# Patient Record
Sex: Male | Born: 1970
Health system: Southern US, Community
[De-identification: ages and names within clinical notes are randomized; demographics above are authoritative.]

## PROBLEM LIST (undated history)

## (undated) DIAGNOSIS — J45909 Unspecified asthma, uncomplicated: Secondary | ICD-10-CM

## (undated) HISTORY — DX: Unspecified asthma, uncomplicated: J45.909

---

## 2004-11-08 ENCOUNTER — Emergency Department: Payer: Self-pay | Admitting: Emergency Medicine

## 2008-10-12 ENCOUNTER — Emergency Department: Payer: Self-pay | Admitting: Emergency Medicine

## 2010-05-26 ENCOUNTER — Inpatient Hospital Stay: Payer: Self-pay | Admitting: Internal Medicine

## 2012-04-08 IMAGING — CT CT NECK WITH CONTRAST
2 series · 10 of 14 positions shown, 12 images · IV contrast (isovue)
Comparison: none

REASON FOR EXAM: Sore throat, likely ludwig's angina, D/W ENT
COMMENTS:

PROCEDURE:     CT  - CT NECK WITH CONTRAST  - May 26, 2010  [DATE]
RESULT:     Comparison: None
TECHNIQUE: Multiple sequential axial images from the apices of the lungs to
the level of the orbits obtained with 75 mL Isovue 370 IV contrast.

[Series 2: soft tissue · axial · 0.49mm/px · z∈[-314,-92]mm · 8 of 96 slices shown, 10 images]
[im 11/96  soft-tissue]
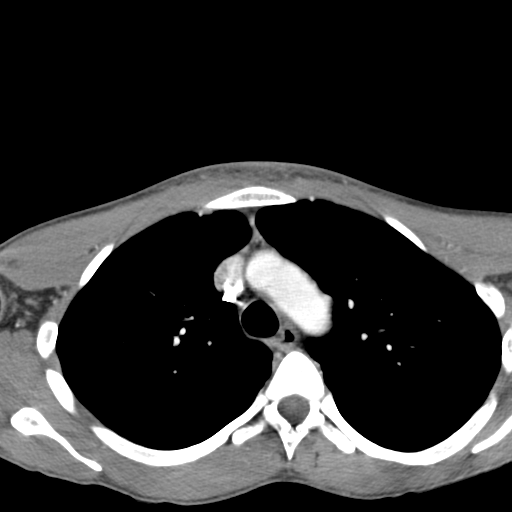
[im 11/96  bone]
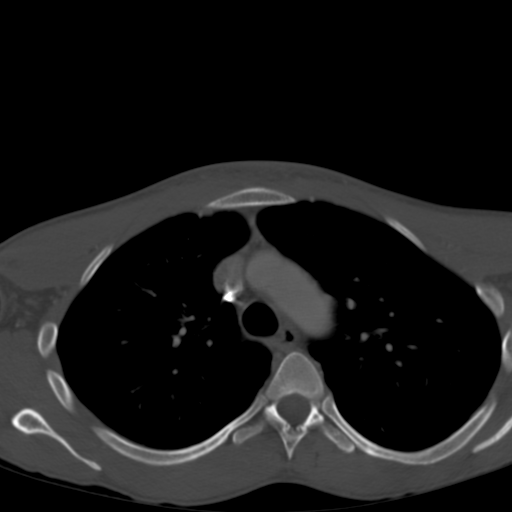
[im 22/96  bone]
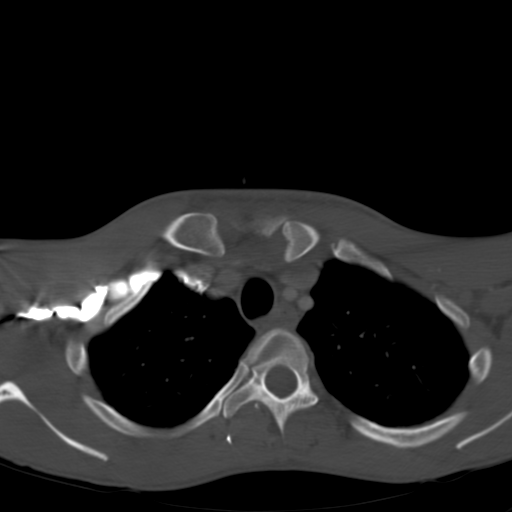
[im 32/96  bone]
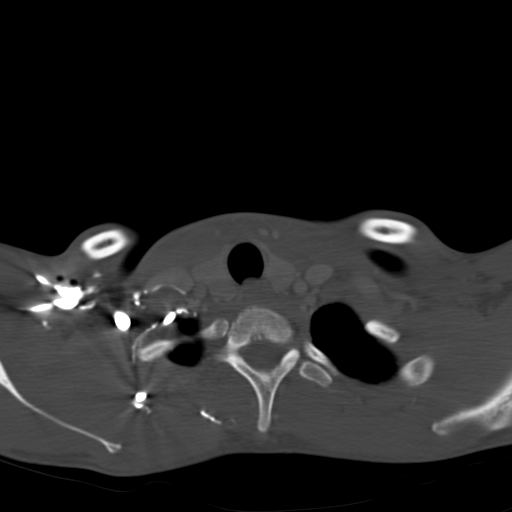
[im 43/96  bone]
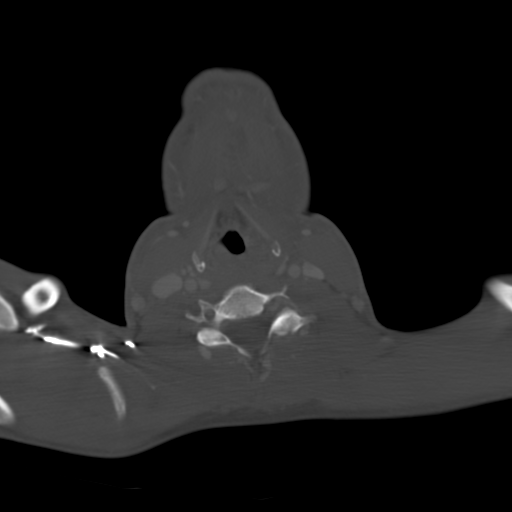
[im 53/96  soft-tissue]
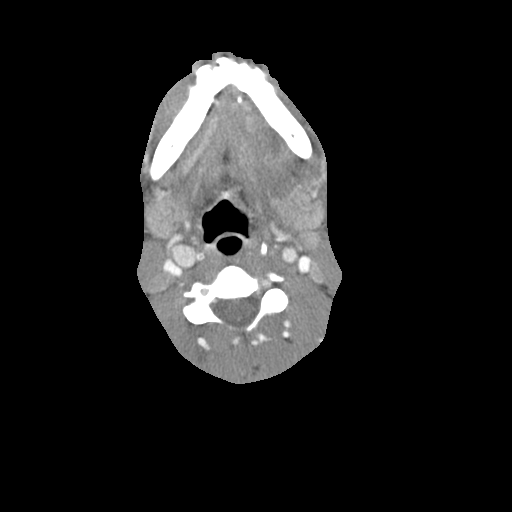
[im 53/96  bone]
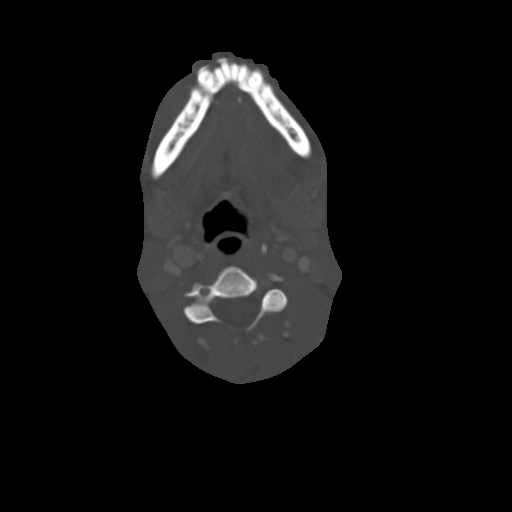
[im 64/96  bone]
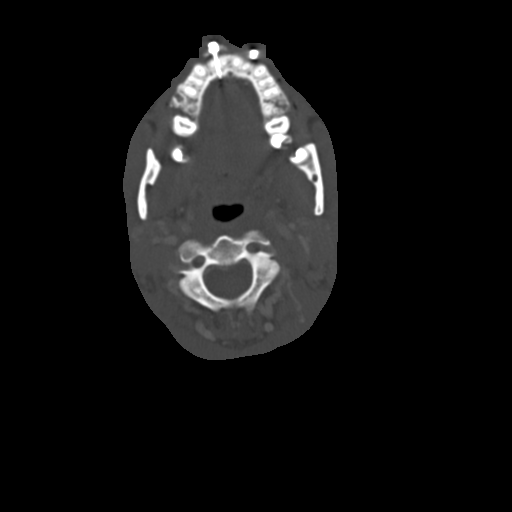
[im 74/96  bone]
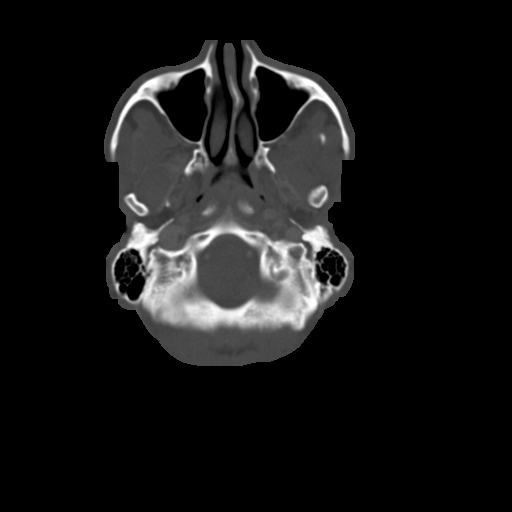
[im 85/96  bone]
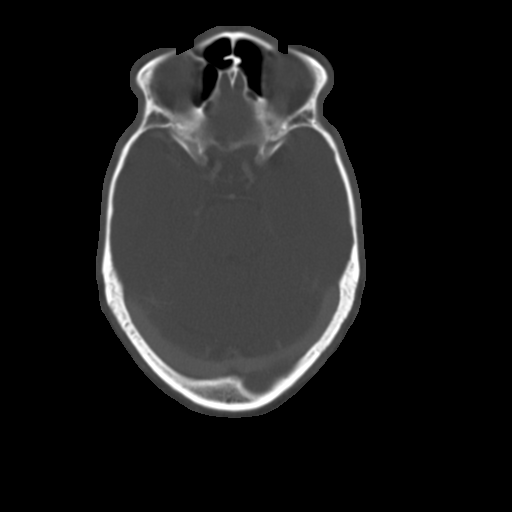

[Series 4: lung windows · axial · 0.61mm/px · z∈[-310,-274]mm · 2 of 36 slices shown]
[im 12/36  bone]
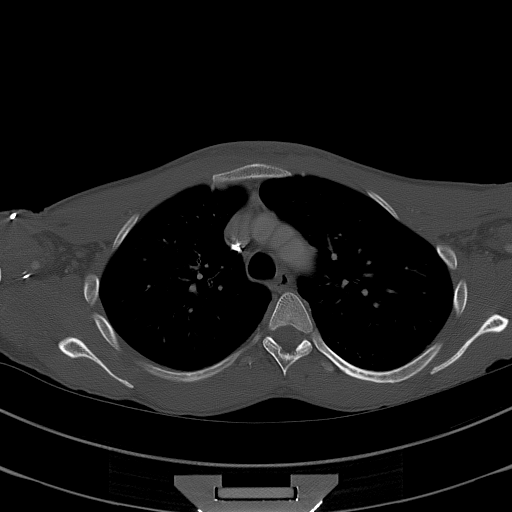
[im 24/36  bone]
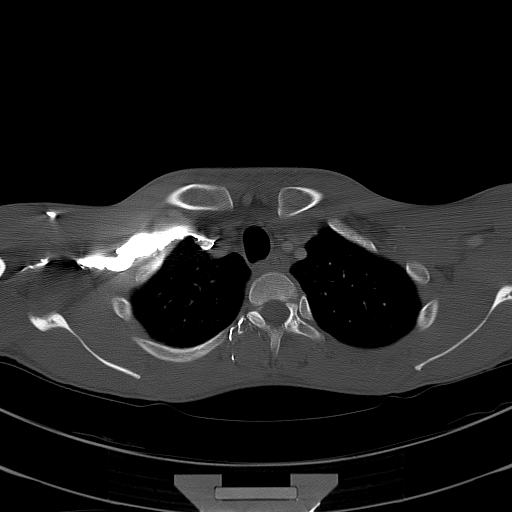

[10 of 14 positions shown; findings below may reference images not displayed]

FINDINGS: There is a 2.2 x 1.6 cm peripherally enhancing fluid collection in the
sublingual space, concerning for developing abscess. There is adjacent
inflammatory changes and soft tissue stranding. This soft tissue thickening
causes minimal mass effect on the anterior airway at the level of the
oropharynx. There is an area of low-attenuation is extends to the left
inferior molar. There are several small adjacent other prominent lymph
nodes. None of these are pathologically enlarged.

The prevertebral soft tissues are within normal limits. The superior
mediastinum is within normal limits. Nonspecific subcutaneous fat stranding
seen along the anterior neck and extending along the visualized anterior
chest wall.

No aggressive but lesions identified. Internal fixation hardware is
penetrated in the symphysis of the mandible.

There are periapical lucencies surrounding the right molars, concerning for
dental caries.
IMPRESSION: Findings concerning for developing abscess in the sublingual space. This may
extend from a left molar. There is minimal mass effect on the airway
anteriorly.

This was reported to Dr. Sheyanale Beckz at4443 hours 05/26/2010.

## 2022-08-05 ENCOUNTER — Encounter: Payer: Self-pay | Admitting: Nurse Practitioner

## 2022-08-05 ENCOUNTER — Ambulatory Visit (INDEPENDENT_AMBULATORY_CARE_PROVIDER_SITE_OTHER): Payer: Medicaid Other | Admitting: Nurse Practitioner

## 2022-08-05 VITALS — BP 126/82 | HR 87 | Ht 70.5 in | Wt 125.9 lb

## 2022-08-05 DIAGNOSIS — R636 Underweight: Secondary | ICD-10-CM

## 2022-08-05 DIAGNOSIS — Z23 Encounter for immunization: Secondary | ICD-10-CM

## 2022-08-05 DIAGNOSIS — J4520 Mild intermittent asthma, uncomplicated: Secondary | ICD-10-CM | POA: Insufficient documentation

## 2022-08-05 DIAGNOSIS — Z681 Body mass index (BMI) 19 or less, adult: Secondary | ICD-10-CM | POA: Diagnosis not present

## 2022-08-05 DIAGNOSIS — Z1211 Encounter for screening for malignant neoplasm of colon: Secondary | ICD-10-CM

## 2022-08-05 MED ORDER — ALBUTEROL SULFATE (2.5 MG/3ML) 0.083% IN NEBU: 2.5000 mg | INHALATION_SOLUTION | Freq: Four times a day (QID) | RESPIRATORY_TRACT | 1 refills | Status: DC | PRN

## 2022-08-05 MED ORDER — ALBUTEROL SULFATE HFA 108 (90 BASE) MCG/ACT IN AERS: 2.0000 | INHALATION_SPRAY | Freq: Four times a day (QID) | RESPIRATORY_TRACT | 2 refills | Status: DC | PRN

## 2022-08-05 NOTE — Progress Notes (Unsigned)
New Patient Office Visit  Subjective    Patient ID: Kyle Shields, male    DOB: May 25, 1971  Age: 51 y.o. MRN: [id number]  CC:  Chief Complaint  Patient presents with   Establish Care    Asthma- has a dx of asthma but not on any meds at this time. He was given albuterol in ER. He thought he grew out of it, but now he gets out of breath more now than before. Gets anxiety around people. His heart rate goes up when he is around people.     HPI Kyle Shields presents to establish care. He lives with her friend.    No outpatient encounter medications on file as of 08/05/2022.   No facility-administered encounter medications on file as of 08/05/2022.    Past Medical History:  Diagnosis Date   Asthma     History reviewed. No pertinent surgical history.  History reviewed. No pertinent family history.  Social History   Socioeconomic History   Marital status: Single    Spouse name: Not on file   Number of children: Not on file   Years of education: Not on file   Highest education level: Not on file  Occupational History   Not on file  Tobacco Use   Smoking status: Every Day    Packs/day: 1.00    Types: Cigarettes   Smokeless tobacco: Never  Substance and Sexual Activity   Alcohol use: Yes    Alcohol/week: 2.0 standard drinks of alcohol    Types: 2 Cans of beer per week   Drug use: Not Currently   Sexual activity: Not on file  Other Topics Concern   Not on file  Social History Narrative   Not on file   Social Determinants of Health   Financial Resource Strain: Not on file  Food Insecurity: Not on file  Transportation Needs: Not on file  Physical Activity: Not on file  Stress: Not on file  Social Connections: Not on file  Intimate Partner Violence: Not on file    Review of Systems  Constitutional: Negative.   HENT: Negative.    Eyes: Negative.   Respiratory:  Positive for shortness of breath.   Cardiovascular:  Negative for chest pain and leg  swelling.  Gastrointestinal: Negative.   Genitourinary: Negative.   Musculoskeletal: Negative.   Neurological: Negative.   Psychiatric/Behavioral: Negative.          Objective    BP 126/82   Pulse 87   Ht 5' 10.5" (1.791 m)   Wt 125 lb 14.4 oz (57.1 kg)   SpO2 98%   BMI 17.81 kg/m   Physical Exam Constitutional:      Appearance: Normal appearance. He is underweight.  HENT:     Right Ear: Tympanic membrane normal.     Left Ear: Tympanic membrane normal.     Mouth/Throat:     Mouth: Mucous membranes are moist.  Cardiovascular:     Rate and Rhythm: Normal rate and regular rhythm.     Pulses: Normal pulses.     Heart sounds: No murmur heard.    No friction rub.  Pulmonary:     Effort: Pulmonary effort is normal.     Breath sounds: Normal breath sounds.  Abdominal:     General: Abdomen is flat. Bowel sounds are normal.     Palpations: Abdomen is soft.  Musculoskeletal:        General: No swelling or signs of injury.  Cervical back: Normal range of motion.  Skin:    Capillary Refill: Capillary refill takes less than 2 seconds.  Neurological:     General: No focal deficit present.     Mental Status: He is alert and oriented to person, place, and time. Mental status is at baseline.  Psychiatric:        Mood and Affect: Mood normal.        Behavior: Behavior normal.        Thought Content: Thought content normal.        Judgment: Judgment normal.     {Labs (Optional):23779}    Assessment & Plan:   Problem List Items Addressed This Visit   None Visit Diagnoses     Screening for colon cancer    -  Primary   Relevant Orders   Ambulatory referral to Gastroenterology   Flu vaccine need       Relevant Orders   Flu Vaccine QUAD 6+ mos PF IM (Fluarix Quad PF)       No follow-ups on file.   Kara Dies, NP

## 2022-08-09 ENCOUNTER — Telehealth: Payer: Self-pay

## 2022-08-09 ENCOUNTER — Other Ambulatory Visit: Payer: Self-pay

## 2022-08-09 DIAGNOSIS — Z1211 Encounter for screening for malignant neoplasm of colon: Secondary | ICD-10-CM

## 2022-08-09 MED ORDER — NA SULFATE-K SULFATE-MG SULF 17.5-3.13-1.6 GM/177ML PO SOLN: 1.0000 | Freq: Once | ORAL | 0 refills | Status: AC

## 2022-08-09 NOTE — Telephone Encounter (Signed)
Gastroenterology Pre-Procedure Review  Request Date: 08/26/22 Requesting Physician: Dr. Tobi Bastos  PATIENT REVIEW QUESTIONS: The patient responded to the following health history questions as indicated:    1. Are you having any GI issues? no 2. Do you have a personal history of Polyps? no 3. Do you have a family history of Colon Cancer or Polyps? yes (maternal grandfather colon cancer) 4. Diabetes Mellitus? no 5. Joint replacements in the past 12 months?no 6. Major health problems in the past 3 months?no 7. Any artificial heart valves, MVP, or defibrillator?no    MEDICATIONS & ALLERGIES:    Patient reports the following regarding taking any anticoagulation/antiplatelet therapy:   Plavix, Coumadin, Eliquis, Xarelto, Lovenox, Pradaxa, Brilinta, or Effient? no Aspirin? no  Patient confirms/reports the following medications:  Current Outpatient Medications  Medication Sig Dispense Refill   albuterol (VENTOLIN HFA) 108 (90 Base) MCG/ACT inhaler Inhale 2 puffs into the lungs every 6 (six) hours as needed for wheezing or shortness of breath. 8 g 2   No current facility-administered medications for this visit.    Patient confirms/reports the following allergies:  No Known Allergies  No orders of the defined types were placed in this encounter.   AUTHORIZATION INFORMATION Primary Insurance: 1D#: Group #:  Secondary Insurance: 1D#: Group #:  SCHEDULE INFORMATION: Date: 08/26/22 Time: Location: [facility]

## 2022-08-11 DIAGNOSIS — R636 Underweight: Secondary | ICD-10-CM | POA: Insufficient documentation

## 2022-08-11 NOTE — Assessment & Plan Note (Signed)
Body mass index is 17.81 kg/m. Encouraged patient to eat healthy and high-protein diet. Will continue to monitor if needed will refer him for nutrition counselor

## 2022-08-11 NOTE — Assessment & Plan Note (Signed)
Started on albuterol inhaler. Advised patient to use albuterol every 4-6 hours as needed. Will continue to monitor.

## 2022-08-12 ENCOUNTER — Ambulatory Visit: Payer: Medicaid Other

## 2022-08-12 DIAGNOSIS — J4520 Mild intermittent asthma, uncomplicated: Secondary | ICD-10-CM

## 2022-08-12 DIAGNOSIS — R636 Underweight: Secondary | ICD-10-CM | POA: Diagnosis not present

## 2022-08-12 DIAGNOSIS — Z13220 Encounter for screening for lipoid disorders: Secondary | ICD-10-CM | POA: Diagnosis not present

## 2022-08-13 LAB — COMPLETE METABOLIC PANEL WITH GFR
AG Ratio: 1.8 (calc) (ref 1.0–2.5)
ALT: 29 U/L (ref 9–46)
AST: 20 U/L (ref 10–35)
Albumin: 4.6 g/dL (ref 3.6–5.1)
Alkaline phosphatase (APISO): 46 U/L (ref 35–144)
BUN: 13 mg/dL (ref 7–25)
CO2: 29 mmol/L (ref 20–32)
Calcium: 9.7 mg/dL (ref 8.6–10.3)
Chloride: 104 mmol/L (ref 98–110)
Creat: 0.99 mg/dL (ref 0.70–1.30)
Globulin: 2.5 g/dL (calc) (ref 1.9–3.7)
Glucose, Bld: 93 mg/dL (ref 65–99)
Potassium: 4.2 mmol/L (ref 3.5–5.3)
Sodium: 140 mmol/L (ref 135–146)
Total Bilirubin: 1.3 mg/dL — ABNORMAL HIGH (ref 0.2–1.2)
Total Protein: 7.1 g/dL (ref 6.1–8.1)
eGFR: 91 mL/min/{1.73_m2} (ref >=60)

## 2022-08-13 LAB — CBC WITH DIFFERENTIAL/PLATELET
Absolute Monocytes: 747 cells/uL (ref 200–950)
Basophils Absolute: 30 cells/uL (ref 0–200)
Basophils Relative: 0.4 %
Eosinophils Absolute: 311 cells/uL (ref 15–500)
Eosinophils Relative: 4.2 %
HCT: 41.5 % (ref 38.5–50.0)
Hemoglobin: 13.9 g/dL (ref 13.2–17.1)
Lymphs Abs: 2020 cells/uL (ref 850–3900)
MCH: 29 pg (ref 27.0–33.0)
MCHC: 33.5 g/dL (ref 32.0–36.0)
MCV: 86.5 fL (ref 80.0–100.0)
MPV: 10.5 fL (ref 7.5–12.5)
Monocytes Relative: 10.1 %
Neutro Abs: 4292 cells/uL (ref 1500–7800)
Neutrophils Relative %: 58 %
Platelets: 313 10*3/uL (ref 140–400)
RBC: 4.8 10*6/uL (ref 4.20–5.80)
RDW: 13 % (ref 11.0–15.0)
Total Lymphocyte: 27.3 %
WBC: 7.4 10*3/uL (ref 3.8–10.8)

## 2022-08-13 LAB — LIPID PANEL
Cholesterol: 208 mg/dL — ABNORMAL HIGH (ref <200)
HDL: 59 mg/dL (ref >=40)
LDL Cholesterol (Calc): 122 mg/dL (calc) — ABNORMAL HIGH
Non-HDL Cholesterol (Calc): 149 mg/dL (calc) — ABNORMAL HIGH (ref <130)
Total CHOL/HDL Ratio: 3.5 (calc) (ref <5.0)
Triglycerides: 156 mg/dL — ABNORMAL HIGH (ref <150)

## 2022-08-25 ENCOUNTER — Encounter: Payer: Self-pay | Admitting: Gastroenterology

## 2022-08-26 ENCOUNTER — Encounter: Payer: Self-pay | Admitting: Gastroenterology

## 2022-08-26 ENCOUNTER — Encounter
Admission: RE | Disposition: A | Discharge status: Home / Self Care | Payer: Self-pay | Source: Home / Self Care | Attending: Gastroenterology

## 2022-08-26 ENCOUNTER — Ambulatory Visit: Payer: Medicaid Other | Admitting: Anesthesiology

## 2022-08-26 ENCOUNTER — Ambulatory Visit
Admission: RE | Admit: 2022-08-26 | Discharge: 2022-08-26 | Disposition: A | Discharge status: Home / Self Care | Payer: Medicaid Other | Attending: Gastroenterology | Admitting: Gastroenterology

## 2022-08-26 DIAGNOSIS — F17200 Nicotine dependence, unspecified, uncomplicated: Secondary | ICD-10-CM | POA: Diagnosis not present

## 2022-08-26 DIAGNOSIS — Z1211 Encounter for screening for malignant neoplasm of colon: Secondary | ICD-10-CM

## 2022-08-26 DIAGNOSIS — F17210 Nicotine dependence, cigarettes, uncomplicated: Secondary | ICD-10-CM | POA: Insufficient documentation

## 2022-08-26 DIAGNOSIS — J45909 Unspecified asthma, uncomplicated: Secondary | ICD-10-CM | POA: Diagnosis not present

## 2022-08-26 HISTORY — PX: COLONOSCOPY WITH PROPOFOL: SHX5780

## 2022-08-26 SURGERY — COLONOSCOPY WITH PROPOFOL
Anesthesia: General

## 2022-08-26 MED ORDER — PROPOFOL 10 MG/ML IV BOLUS
INTRAVENOUS | Status: DC | PRN
  Administered 2022-08-26: 40 mg via INTRAVENOUS

## 2022-08-26 MED ORDER — EPHEDRINE 5 MG/ML INJ
INTRAVENOUS | Status: AC
  Filled 2022-08-26: qty 5

## 2022-08-26 MED ORDER — SODIUM CHLORIDE 0.9 % IV SOLN: INTRAVENOUS | Status: DC

## 2022-08-26 MED ORDER — PROPOFOL 500 MG/50ML IV EMUL
INTRAVENOUS | Status: DC | PRN
  Administered 2022-08-26: 150 ug/kg/min via INTRAVENOUS

## 2022-08-26 MED ORDER — LIDOCAINE HCL (CARDIAC) PF 100 MG/5ML IV SOSY
PREFILLED_SYRINGE | INTRAVENOUS | Status: DC | PRN
  Administered 2022-08-26: 40 mg via INTRAVENOUS

## 2022-08-26 MED ORDER — PROPOFOL 1000 MG/100ML IV EMUL
INTRAVENOUS | Status: AC
  Filled 2022-08-26: qty 100

## 2022-08-26 NOTE — H&P (Signed)
     Jonathon Bellows, MD [address] [place], [place], [place], [address] [address], [place], [place], [place], [address] Phone: [phone]  Fax: [phone]  Primary Care Physician:  Theresia Lo, NP   Pre-Procedure History & Physical: HPI:  RANDIE TALLARICO is a 52 y.o. male is here for an colonoscopy.   Past Medical History:  Diagnosis Date   Asthma     History reviewed. No pertinent surgical history.  Prior to Admission medications   Medication Sig Start Date End Date Taking? Authorizing Provider  albuterol (VENTOLIN HFA) 108 (90 Base) MCG/ACT inhaler Inhale 2 puffs into the lungs every 6 (six) hours as needed for wheezing or shortness of breath. 08/05/22  Yes Theresia Lo, NP    Allergies as of 08/09/2022   (No Known Allergies)    Family History  Problem Relation Age of Onset   Cancer Maternal Grandfather     Social History   Socioeconomic History   Marital status: Single    Spouse name: Not on file   Number of children: Not on file   Years of education: Not on file   Highest education level: Not on file  Occupational History   Not on file  Tobacco Use   Smoking status: Every Day    Packs/day: 1.00    Types: Cigarettes   Smokeless tobacco: Never  Substance and Sexual Activity   Alcohol use: Yes    Alcohol/week: 2.0 standard drinks of alcohol    Types: 2 Cans of beer per week   Drug use: Not Currently   Sexual activity: Not on file  Other Topics Concern   Not on file  Social History Narrative   Not on file   Social Determinants of Health   Financial Resource Strain: Not on file  Food Insecurity: Not on file  Transportation Needs: Not on file  Physical Activity: Not on file  Stress: Not on file  Social Connections: Not on file  Intimate Partner Violence: Not on file    Review of Systems: See HPI, otherwise negative ROS  Physical Exam: BP 114/85   Pulse 74   Temp (!) 97.1 F (36.2 C) (Temporal)   Resp 17   Ht 5\' 10"   (1.778 m)   Wt 55.3 kg   BMI 17.51 kg/m  General:   Alert,  pleasant and cooperative in NAD Head:  Normocephalic and atraumatic. Neck:  Supple; no masses or thyromegaly. Lungs:  Clear throughout to auscultation, normal respiratory effort.    Heart:  +S1, +S2, Regular rate and rhythm, No edema. Abdomen:  Soft, nontender and nondistended. Normal bowel sounds, without guarding, and without rebound.   Neurologic:  Alert and  oriented x4;  grossly normal neurologically.  Impression/Plan: NAMAN SPYCHALSKI is here for an colonoscopy to be performed for Screening colonoscopy average risk   Risks, benefits, limitations, and alternatives regarding  colonoscopy have been reviewed with the patient.  Questions have been answered.  All parties agreeable.   Jonathon Bellows, MD  08/26/2022, 9:29 AM

## 2022-08-26 NOTE — Transfer of Care (Signed)
Immediate Anesthesia Transfer of Care Note  Patient: Kyle Shields  Procedure(s) Performed: COLONOSCOPY WITH PROPOFOL  Patient Location: PACU  Anesthesia Type:General  Level of Consciousness: awake and alert   Airway & Oxygen Therapy: Patient Spontanous Breathing  Post-op Assessment: Report given to RN and Post -op Vital signs reviewed and stable  Post vital signs: Reviewed and stable  Last Vitals:  Vitals Value Taken Time  BP 90/68 08/26/22 0954  Temp 36.1 C 08/26/22 0953  Pulse 74 08/26/22 0954  Resp 23 08/26/22 0954  SpO2 99 % 08/26/22 0954  Vitals shown include unvalidated device data.  Last Pain:  Vitals:   08/26/22 0953  TempSrc: Temporal  PainSc: Asleep         Complications: No notable events documented.

## 2022-08-26 NOTE — Anesthesia Preprocedure Evaluation (Addendum)
Anesthesia Evaluation  Patient identified by MRN, date of birth, ID band Patient awake    Reviewed: Allergy & Precautions, NPO status , Patient's Chart, lab work & pertinent test results  History of Anesthesia Complications Negative for: history of anesthetic complications  Airway Mallampati: I   Neck ROM: Full    Dental  (+) Missing   Pulmonary asthma , Current Smoker (2 cigarettes per week) and Patient abstained from smoking.   Pulmonary exam normal breath sounds clear to auscultation       Cardiovascular Exercise Tolerance: Good negative cardio ROS Normal cardiovascular exam Rhythm:Regular Rate:Normal     Neuro/Psych negative neurological ROS     GI/Hepatic negative GI ROS,,,  Endo/Other  negative endocrine ROS    Renal/GU negative Renal ROS     Musculoskeletal   Abdominal   Peds  Hematology negative hematology ROS (+)   Anesthesia Other Findings   Reproductive/Obstetrics                             Anesthesia Physical Anesthesia Plan  ASA: 2  Anesthesia Plan: General   Post-op Pain Management:    Induction: Intravenous  PONV Risk Score and Plan: 1 and Propofol infusion, TIVA and Treatment may vary due to age or medical condition  Airway Management Planned: Natural Airway  Additional Equipment:   Intra-op Plan:   Post-operative Plan:   Informed Consent: I have reviewed the patients History and Physical, chart, labs and discussed the procedure including the risks, benefits and alternatives for the proposed anesthesia with the patient or authorized representative who has indicated his/her understanding and acceptance.       Plan Discussed with: CRNA  Anesthesia Plan Comments: (LMA/GETA backup discussed.  Patient consented for risks of anesthesia including but not limited to:  - adverse reactions to medications - damage to eyes, teeth, lips or other oral mucosa -  nerve damage due to positioning  - sore throat or hoarseness - damage to heart, brain, nerves, lungs, other parts of body or loss of life  Informed patient about role of CRNA in peri- and intra-operative care.  Patient voiced understanding.)       Anesthesia Quick Evaluation

## 2022-08-26 NOTE — Anesthesia Postprocedure Evaluation (Signed)
Anesthesia Post Note  Patient: Kyle Shields  Procedure(s) Performed: COLONOSCOPY WITH PROPOFOL  Patient location during evaluation: PACU Anesthesia Type: General Level of consciousness: awake and alert, oriented and patient cooperative Pain management: pain level controlled Vital Signs Assessment: post-procedure vital signs reviewed and stable Respiratory status: spontaneous breathing, nonlabored ventilation and respiratory function stable Cardiovascular status: blood pressure returned to baseline and stable Postop Assessment: adequate PO intake Anesthetic complications: no   No notable events documented.   Last Vitals:  Vitals:   08/26/22 1003 08/26/22 1013  BP: (!) 132/101 (!) 127/94  Pulse:    Resp:    Temp:      Last Pain:  Vitals:   08/26/22 1013  TempSrc:   PainSc: 0-No pain                 Darrin Nipper

## 2022-08-26 NOTE — Op Note (Signed)
[facility] Gastroenterology Patient Name: Kyle Shields Procedure Date: 08/26/2022 9:34 AM MRN: [id number] Account #: [id number] Date of Birth: Nov 10, 1970 Admit Type: Outpatient Age: 52 Room: [facility] ENDO ROOM 3 Gender: Male Note Status: Finalized Instrument Name: Jasper Riling [id number] Procedure:             Colonoscopy Indications:           Screening for colorectal malignant neoplasm Providers:             Jonathon Bellows MD, MD Referring MD:          Theresia Lo (Referring MD) Medicines:             Monitored Anesthesia Care Complications:         No immediate complications. Procedure:             Pre-Anesthesia Assessment:                        - Prior to the procedure, a History and Physical was                         performed, and patient medications, allergies and                         sensitivities were reviewed. The patient's tolerance                         of previous anesthesia was reviewed.                        - The risks and benefits of the procedure and the                         sedation options and risks were discussed with the                         patient. All questions were answered and informed                         consent was obtained.                        - ASA Grade Assessment: II - A patient with mild                         systemic disease.                        After obtaining informed consent, the colonoscope was                         passed under direct vision. Throughout the procedure,                         the patient's blood pressure, pulse, and oxygen                         saturations were monitored continuously. The                         Colonoscope was introduced through  the anus and                         advanced to the the cecum, identified by the                         appendiceal orifice. The colonoscopy was performed                         with ease. The patient tolerated the procedure well.                          The quality of the bowel preparation was excellent.                         The ileocecal valve, appendiceal orifice, and rectum                         were photographed. Findings:      The entire examined colon appeared normal on direct and retroflexion       views. Impression:            - The entire examined colon is normal on direct and                         retroflexion views.                        - No specimens collected. Recommendation:        - Discharge patient to home (with escort).                        - Resume previous diet.                        - Continue present medications.                        - Repeat colonoscopy in 10 years for screening                         purposes. Procedure Code(s):     --- Professional ---                        [phone], Colonoscopy, flexible; diagnostic, including                         collection of specimen(s) by brushing or washing, when                         performed (separate procedure) Diagnosis Code(s):     --- Professional ---                        Z12.11, Encounter for screening for malignant neoplasm                         of colon CPT copyright 2022 American [employer]. All rights reserved. The codes documented in this report are preliminary and upon coder review may  be revised to meet current compliance  requirements. Jonathon Bellows, MD Jonathon Bellows MD, MD 08/26/2022 9:51:35 AM This report has been signed electronically. Number of Addenda: 0 Note Initiated On: 08/26/2022 9:34 AM Scope Withdrawal Time: 0 hours 7 minutes 20 seconds  Total Procedure Duration: 0 hours 9 minutes 30 seconds  Estimated Blood Loss:  Estimated blood loss: none.      [facility]

## 2022-08-27 ENCOUNTER — Encounter: Payer: Self-pay | Admitting: Gastroenterology

## 2022-08-27 DIAGNOSIS — H5213 Myopia, bilateral: Secondary | ICD-10-CM | POA: Diagnosis not present

## 2022-10-12 DIAGNOSIS — H524 Presbyopia: Secondary | ICD-10-CM | POA: Diagnosis not present

## 2022-10-15 DIAGNOSIS — Z87891 Personal history of nicotine dependence: Secondary | ICD-10-CM | POA: Diagnosis not present

## 2022-10-15 DIAGNOSIS — Z809 Family history of malignant neoplasm, unspecified: Secondary | ICD-10-CM | POA: Diagnosis not present

## 2022-10-15 DIAGNOSIS — J45909 Unspecified asthma, uncomplicated: Secondary | ICD-10-CM | POA: Diagnosis not present

## 2022-10-26 ENCOUNTER — Other Ambulatory Visit: Payer: Self-pay | Admitting: Nurse Practitioner

## 2023-01-18 ENCOUNTER — Other Ambulatory Visit: Payer: Self-pay | Admitting: Nurse Practitioner

## 2023-05-04 ENCOUNTER — Other Ambulatory Visit: Payer: Self-pay | Admitting: Nurse Practitioner

## 2023-05-05 ENCOUNTER — Telehealth: Payer: Self-pay

## 2023-05-05 ENCOUNTER — Encounter: Payer: Self-pay | Admitting: Nurse Practitioner

## 2023-05-05 NOTE — Telephone Encounter (Signed)
Left voicemail for patient asking him to please call us.  When patient calls back, please let him know that his transfer of care appointment with Kara Dies, NP, has been scheduled for 05/13/2023 at 1:40pm.    If this date/time does not work for patient, please schedule him in a 20-minute slot that is followed by two PCP Chart Review slots.

## 2023-05-13 ENCOUNTER — Encounter: Payer: Self-pay | Admitting: Nurse Practitioner

## 2023-05-13 ENCOUNTER — Ambulatory Visit: Payer: Medicaid Other | Admitting: Nurse Practitioner

## 2023-05-13 VITALS — BP 128/84 | HR 95 | Temp 98.1°F | Ht 70.0 in | Wt 132.0 lb

## 2023-05-13 DIAGNOSIS — Z23 Encounter for immunization: Secondary | ICD-10-CM | POA: Diagnosis not present

## 2023-05-13 DIAGNOSIS — J4521 Mild intermittent asthma with (acute) exacerbation: Secondary | ICD-10-CM

## 2023-05-13 DIAGNOSIS — E785 Hyperlipidemia, unspecified: Secondary | ICD-10-CM

## 2023-05-13 DIAGNOSIS — Z131 Encounter for screening for diabetes mellitus: Secondary | ICD-10-CM

## 2023-05-13 MED ORDER — SYMBICORT 80-4.5 MCG/ACT IN AERO: 2.0000 | INHALATION_SPRAY | Freq: Two times a day (BID) | RESPIRATORY_TRACT | 3 refills | Status: DC

## 2023-05-13 NOTE — Patient Instructions (Signed)
Please go to the lab for blood work. Symbicort sent to the pharmacy.

## 2023-05-13 NOTE — Assessment & Plan Note (Signed)
Will check lipid panel. Advised to consume heart healthy diet and regular exercise routine.

## 2023-05-13 NOTE — Assessment & Plan Note (Addendum)
He has been experiencing asthma flare recently.  Using albuterol 4-5 times a day. Physical examination reassuring.   Will Start him on Symbicort 2 puffs twice a day. Will continue to monitor.

## 2023-05-14 LAB — CBC WITH DIFFERENTIAL/PLATELET
Basophils Absolute: 0 10*3/uL (ref 0.0–0.2)
Basos: 1 %
EOS (ABSOLUTE): 0.3 10*3/uL (ref 0.0–0.4)
Eos: 3 %
Hematocrit: 42.2 % (ref 37.5–51.0)
Hemoglobin: 13.6 g/dL (ref 13.0–17.7)
Immature Grans (Abs): 0 10*3/uL (ref 0.0–0.1)
Immature Granulocytes: 0 %
Lymphocytes Absolute: 1.9 10*3/uL (ref 0.7–3.1)
Lymphs: 21 %
MCH: 28 pg (ref 26.6–33.0)
MCHC: 32.2 g/dL (ref 31.5–35.7)
MCV: 87 fL (ref 79–97)
Monocytes Absolute: 0.7 10*3/uL (ref 0.1–0.9)
Monocytes: 8 %
Neutrophils Absolute: 5.9 10*3/uL (ref 1.4–7.0)
Neutrophils: 67 %
Platelets: 268 10*3/uL (ref 150–450)
RBC: 4.86 x10E6/uL (ref 4.14–5.80)
RDW: 13.7 % (ref 11.6–15.4)
WBC: 8.8 10*3/uL (ref 3.4–10.8)

## 2023-05-14 LAB — COMPREHENSIVE METABOLIC PANEL
ALT: 22 IU/L (ref 0–44)
AST: 18 IU/L (ref 0–40)
Albumin: 5 g/dL — ABNORMAL HIGH (ref 3.8–4.9)
Alkaline Phosphatase: 57 IU/L (ref 44–121)
BUN/Creatinine Ratio: 10 (ref 9–20)
BUN: 10 mg/dL (ref 6–24)
Bilirubin Total: 1 mg/dL (ref 0.0–1.2)
CO2: 24 mmol/L (ref 20–29)
Calcium: 9.8 mg/dL (ref 8.7–10.2)
Chloride: 103 mmol/L (ref 96–106)
Creatinine, Ser: 1.04 mg/dL (ref 0.76–1.27)
Globulin, Total: 2.5 g/dL (ref 1.5–4.5)
Glucose: 93 mg/dL (ref 70–99)
Potassium: 4.1 mmol/L (ref 3.5–5.2)
Sodium: 143 mmol/L (ref 134–144)
Total Protein: 7.5 g/dL (ref 6.0–8.5)
eGFR: 85 mL/min/{1.73_m2} (ref >59)

## 2023-05-14 LAB — HEMOGLOBIN A1C
Est. average glucose Bld gHb Est-mCnc: 123 mg/dL
Hgb A1c MFr Bld: 5.9 % — ABNORMAL HIGH (ref 4.8–5.6)

## 2023-05-14 LAB — LIPID PANEL
Chol/HDL Ratio: 4.8 ratio (ref 0.0–5.0)
Cholesterol, Total: 212 mg/dL — ABNORMAL HIGH (ref 100–199)
HDL: 44 mg/dL (ref >39)
LDL Chol Calc (NIH): 143 mg/dL — ABNORMAL HIGH (ref 0–99)
Triglycerides: 138 mg/dL (ref 0–149)
VLDL Cholesterol Cal: 25 mg/dL (ref 5–40)

## 2023-05-14 LAB — LDL CHOLESTEROL, DIRECT: LDL Direct: 143 mg/dL — ABNORMAL HIGH (ref 0–99)

## 2023-05-15 ENCOUNTER — Encounter: Payer: Self-pay | Admitting: Nurse Practitioner

## 2023-06-04 ENCOUNTER — Other Ambulatory Visit: Payer: Self-pay | Admitting: Nurse Practitioner

## 2023-08-31 LAB — TSH: TSH: 0.815 u[IU]/mL (ref 0.450–4.500)

## 2023-08-31 LAB — SPECIMEN STATUS REPORT

## 2023-11-10 ENCOUNTER — Encounter: Payer: Self-pay | Admitting: Nurse Practitioner

## 2023-11-10 ENCOUNTER — Ambulatory Visit (INDEPENDENT_AMBULATORY_CARE_PROVIDER_SITE_OTHER): Payer: Medicaid Other | Admitting: Nurse Practitioner

## 2023-11-10 VITALS — BP 112/68 | HR 83 | Temp 98.2°F | Ht 70.0 in | Wt 131.2 lb

## 2023-11-10 DIAGNOSIS — J4520 Mild intermittent asthma, uncomplicated: Secondary | ICD-10-CM

## 2023-11-10 DIAGNOSIS — E785 Hyperlipidemia, unspecified: Secondary | ICD-10-CM | POA: Diagnosis not present

## 2023-11-10 DIAGNOSIS — R7303 Prediabetes: Secondary | ICD-10-CM | POA: Diagnosis not present

## 2023-11-10 MED ORDER — ALBUTEROL SULFATE HFA 108 (90 BASE) MCG/ACT IN AERS: 2.0000 | INHALATION_SPRAY | Freq: Four times a day (QID) | RESPIRATORY_TRACT | 3 refills | Status: AC | PRN

## 2023-11-10 MED ORDER — BUDESONIDE-FORMOTEROL FUMARATE 80-4.5 MCG/ACT IN AERO: 2.0000 | INHALATION_SPRAY | Freq: Two times a day (BID) | RESPIRATORY_TRACT | 3 refills | Status: AC

## 2023-11-10 NOTE — Progress Notes (Signed)
 Established Patient Office Visit  Subjective:  Patient ID: Kyle Shields, male    DOB: 03-Sep-1970  Age: 53 y.o. MRN: [id number]  CC:  Chief Complaint  Patient presents with   Medical Management of Chronic Issues   Discussed the use of a AI scribe software for clinical note transcription with the patient, who gave verbal consent to proceed.  HPI Kyle Shields is a 53 year old male with asthma who presents for a routine follow-up visit.  He manages his asthma with Symbicort and an albuterol inhaler as needed. Since starting Symbicort, his asthma symptoms have improved significantly, allowing him to go two to three days without needing it. He does not use Symbicort daily.  He has a history of slightly elevated lipid levels and is making efforts to eat healthily. He is also prediabetic and is monitoring his diet accordingly.  HPI   Past Medical History:  Diagnosis Date   Asthma     Past Surgical History:  Procedure Laterality Date   COLONOSCOPY WITH PROPOFOL N/A 08/26/2022   Procedure: COLONOSCOPY WITH PROPOFOL;  Surgeon: Wyline Mood, MD;  Location: [facility] ENDOSCOPY;  Service: Gastroenterology;  Laterality: N/A;  MEDICAID TRANSPORTATION - NEEDS 8 AM ARRIVAL    Family History  Problem Relation Age of Onset   Cancer Maternal Grandfather     Social History   Socioeconomic History   Marital status: Single    Spouse name: Not on file   Number of children: Not on file   Years of education: Not on file   Highest education level: Some college, no degree  Occupational History   Not on file  Tobacco Use   Smoking status: Former    Current packs/day: 1.00    Types: Cigarettes   Smokeless tobacco: Never   Tobacco comments:    Quit cold [place] March, 2024  Substance and Sexual Activity   Alcohol use: Yes    Alcohol/week: 12.0 standard drinks of alcohol    Types: 12 Cans of beer per week   Drug use: Not Currently   Sexual activity: Not on file  Other Topics Concern    Not on file  Social History Narrative   Not on file   Social Drivers of Health   Financial Resource Strain: Low Risk  (11/07/2023)   Overall Financial Resource Strain (CARDIA)    Difficulty of Paying Living Expenses: Not very hard  Food Insecurity: No Food Insecurity (11/07/2023)   Hunger Vital Sign    Worried About Running Out of Food in the Last Year: Never true    Ran Out of Food in the Last Year: Never true  Transportation Needs: No Transportation Needs (11/07/2023)   PRAPARE - Administrator, Civil Service (Medical): No    Lack of Transportation (Non-Medical): No  Physical Activity: Insufficiently Active (11/07/2023)   Exercise Vital Sign    Days of Exercise per Week: 3 days    Minutes of Exercise per Session: 30 min  Stress: Stress Concern Present (11/07/2023)   [employer] of Occupational Health - Occupational Stress Questionnaire    Feeling of Stress : To some extent  Social Connections: Moderately Integrated (11/07/2023)   Social Connection and Isolation Panel [NHANES]    Frequency of Communication with Friends and Family: Twice a week    Frequency of Social Gatherings with Friends and Family: Once a week    Attends Religious Services: 1 to 4 times per year    Active Member of [employer] or Organizations:  No    Attends Banker Meetings: Not on file    Marital Status: Living with partner  Intimate Partner Violence: Not on file     Outpatient Medications Prior to Visit  Medication Sig Dispense Refill   budesonide-formoterol (SYMBICORT) 80-4.5 MCG/ACT inhaler Inhale 2 puffs into the lungs 2 (two) times daily. 10.2 g 3   VENTOLIN HFA 108 (90 Base) MCG/ACT inhaler INHALE 2 PUFFS BY MOUTH EVERY 6 HOURS AS NEEDED FOR WHEEZE OR SHORTNESS OF BREATH 18 each 0   No facility-administered medications prior to visit.    No Known Allergies  ROS Review of Systems Negative unless indicated in HPI.    Objective:    Physical Exam Constitutional:       Appearance: Normal appearance.  HENT:     Mouth/Throat:     Mouth: Mucous membranes are moist.  Eyes:     Conjunctiva/sclera: Conjunctivae normal.     Pupils: Pupils are equal, round, and reactive to light.  Cardiovascular:     Rate and Rhythm: Normal rate and regular rhythm.     Pulses: Normal pulses.     Heart sounds: Normal heart sounds.  Pulmonary:     Effort: Pulmonary effort is normal.     Breath sounds: Normal breath sounds.  Abdominal:     General: Bowel sounds are normal.     Palpations: Abdomen is soft.  Musculoskeletal:     Cervical back: Normal range of motion. No tenderness.  Skin:    General: Skin is warm.     Findings: No bruising.  Neurological:     General: No focal deficit present.     Mental Status: He is alert and oriented to person, place, and time. Mental status is at baseline.  Psychiatric:        Mood and Affect: Mood normal.        Behavior: Behavior normal.        Thought Content: Thought content normal.        Judgment: Judgment normal.     BP 112/68   Pulse 83   Temp 98.2 F (36.8 C)   Ht 5\' 10"  (1.778 m)   Wt 131 lb 3.2 oz (59.5 kg)   SpO2 97%   BMI 18.83 kg/m  Wt Readings from Last 3 Encounters:  11/10/23 131 lb 3.2 oz (59.5 kg)  05/13/23 132 lb (59.9 kg)  08/26/22 122 lb (55.3 kg)     Health Maintenance  Topic Date Due   Pneumococcal Vaccine 14-28 Years old (1 of 2 - PCV) Never done   HIV Screening  Never done   Hepatitis C Screening  Never done   Zoster Vaccines- Shingrix (1 of 2) Never done   COVID-19 Vaccine (1 - 2024-25 season) 11/23/2023 (Originally 04/24/2023)   DTaP/Tdap/Td (2 - Td or Tdap) 08/14/2031   Colonoscopy  08/26/2032   INFLUENZA VACCINE  Completed   HPV VACCINES  Aged Out    There are no preventive care reminders to display for this patient.  Lab Results  Component Value Date   TSH 0.815 05/13/2023   Lab Results  Component Value Date   WBC 8.8 05/13/2023   HGB 13.6 05/13/2023   HCT 42.2 05/13/2023    MCV 87 05/13/2023   PLT 268 05/13/2023   Lab Results  Component Value Date   NA 143 05/13/2023   K 4.1 05/13/2023   CO2 24 05/13/2023   GLUCOSE 93 05/13/2023   BUN 10 05/13/2023   CREATININE 1.04  05/13/2023   BILITOT 1.0 05/13/2023   ALKPHOS 57 05/13/2023   AST 18 05/13/2023   ALT 22 05/13/2023   PROT 7.5 05/13/2023   ALBUMIN 5.0 (H) 05/13/2023   CALCIUM 9.8 05/13/2023   EGFR 85 05/13/2023   Lab Results  Component Value Date   CHOL 212 (H) 05/13/2023   Lab Results  Component Value Date   HDL 44 05/13/2023   Lab Results  Component Value Date   LDLCALC 143 (H) 05/13/2023   Lab Results  Component Value Date   TRIG 138 05/13/2023   Lab Results  Component Value Date   CHOLHDL 4.8 05/13/2023   Lab Results  Component Value Date   HGBA1C 5.9 (H) 05/13/2023      Assessment & Plan:  Mild intermittent asthma without complication Assessment & Plan: Asthma well-controlled with Symbicort, used less frequently. Albuterol inhaler as needed. - Refill Symbicort prescription  Orders: -     Budesonide-Formoterol Fumarate; Inhale 2 puffs into the lungs 2 (two) times daily.  Dispense: 10.2 g; Refill: 3 -     Albuterol Sulfate HFA; Inhale 2 puffs into the lungs every 6 (six) hours as needed for wheezing or shortness of breath.  Dispense: 18 each; Refill: 3  Dyslipidemia Assessment & Plan: Slightly elevated lipid levels. Advised dietary modifications. - Encourage a healthy diet. Lab Results  Component Value Date   CHOL 212 (H) 05/13/2023   HDL 44 05/13/2023   LDLCALC 143 (H) 05/13/2023   LDLDIRECT 143 (H) 05/13/2023   TRIG 138 05/13/2023   CHOLHDL 4.8 05/13/2023       Prediabetes Assessment & Plan: Emphasized dietary management to prevent progression to diabetes. - Advise close dietary monitoring. Lab Results  Component Value Date   HGBA1C 5.9 (H) 05/13/2023        Follow-up: Return in about 6 months (around 05/12/2024) for physical, follow up with fasting  lab 2 days prior.   Kara Dies, NP

## 2023-11-20 DIAGNOSIS — R7303 Prediabetes: Secondary | ICD-10-CM | POA: Insufficient documentation

## 2023-11-20 NOTE — Assessment & Plan Note (Signed)
 Emphasized dietary management to prevent progression to diabetes. - Advise close dietary monitoring. Lab Results  Component Value Date   HGBA1C 5.9 (H) 05/13/2023

## 2023-11-20 NOTE — Assessment & Plan Note (Signed)
 Slightly elevated lipid levels. Advised dietary modifications. - Encourage a healthy diet. Lab Results  Component Value Date   CHOL 212 (H) 05/13/2023   HDL 44 05/13/2023   LDLCALC 143 (H) 05/13/2023   LDLDIRECT 143 (H) 05/13/2023   TRIG 138 05/13/2023   CHOLHDL 4.8 05/13/2023

## 2023-11-20 NOTE — Assessment & Plan Note (Signed)
 Asthma well-controlled with Symbicort, used less frequently. Albuterol inhaler as needed. - Refill Symbicort prescription

## 2024-05-14 ENCOUNTER — Other Ambulatory Visit

## 2024-05-17 ENCOUNTER — Other Ambulatory Visit: Payer: Self-pay | Admitting: Nurse Practitioner

## 2024-05-17 ENCOUNTER — Ambulatory Visit: Admitting: Nurse Practitioner

## 2024-05-17 ENCOUNTER — Other Ambulatory Visit (INDEPENDENT_AMBULATORY_CARE_PROVIDER_SITE_OTHER)

## 2024-05-17 DIAGNOSIS — J4520 Mild intermittent asthma, uncomplicated: Secondary | ICD-10-CM

## 2024-05-17 DIAGNOSIS — E785 Hyperlipidemia, unspecified: Secondary | ICD-10-CM

## 2024-05-17 DIAGNOSIS — R7303 Prediabetes: Secondary | ICD-10-CM | POA: Diagnosis not present

## 2024-05-17 LAB — CBC WITH DIFFERENTIAL/PLATELET
Basophils Absolute: 0 K/uL (ref 0.0–0.1)
Basophils Relative: 0.5 % (ref 0.0–3.0)
Eosinophils Absolute: 0.3 K/uL (ref 0.0–0.7)
Eosinophils Relative: 5.3 % — ABNORMAL HIGH (ref 0.0–5.0)
HCT: 40.7 % (ref 39.0–52.0)
Hemoglobin: 13.3 g/dL (ref 13.0–17.0)
Lymphocytes Relative: 28.7 % (ref 12.0–46.0)
Lymphs Abs: 1.6 K/uL (ref 0.7–4.0)
MCHC: 32.6 g/dL (ref 30.0–36.0)
MCV: 88.3 fl (ref 78.0–100.0)
Monocytes Absolute: 0.6 K/uL (ref 0.1–1.0)
Monocytes Relative: 10.8 % (ref 3.0–12.0)
Neutro Abs: 3 K/uL (ref 1.4–7.7)
Neutrophils Relative %: 54.7 % (ref 43.0–77.0)
Platelets: 191 K/uL (ref 150.0–400.0)
RBC: 4.62 Mil/uL (ref 4.22–5.81)
RDW: 13.8 % (ref 11.5–15.5)
WBC: 5.4 K/uL (ref 4.0–10.5)

## 2024-05-17 LAB — COMPREHENSIVE METABOLIC PANEL WITH GFR
ALT: 30 U/L (ref 0–53)
AST: 28 U/L (ref 0–37)
Albumin: 4.5 g/dL (ref 3.5–5.2)
Alkaline Phosphatase: 46 U/L (ref 39–117)
BUN: 15 mg/dL (ref 6–23)
CO2: 28 meq/L (ref 19–32)
Calcium: 9.3 mg/dL (ref 8.4–10.5)
Chloride: 104 meq/L (ref 96–112)
Creatinine, Ser: 1.06 mg/dL (ref 0.40–1.50)
GFR: 79.1 mL/min (ref >60.00)
Glucose, Bld: 105 mg/dL — ABNORMAL HIGH (ref 70–99)
Potassium: 4 meq/L (ref 3.5–5.1)
Sodium: 141 meq/L (ref 135–145)
Total Bilirubin: 1 mg/dL (ref 0.2–1.2)
Total Protein: 7.1 g/dL (ref 6.0–8.3)

## 2024-05-17 LAB — LIPID PANEL
Cholesterol: 194 mg/dL (ref 0–200)
HDL: 47.3 mg/dL (ref >39.00)
LDL Cholesterol: 122 mg/dL — ABNORMAL HIGH (ref 0–99)
NonHDL: 147.05
Total CHOL/HDL Ratio: 4
Triglycerides: 123 mg/dL (ref 0.0–149.0)
VLDL: 24.6 mg/dL (ref 0.0–40.0)

## 2024-05-17 LAB — TSH: TSH: 1.41 u[IU]/mL (ref 0.35–5.50)

## 2024-05-17 LAB — HEMOGLOBIN A1C: Hgb A1c MFr Bld: 6.4 % (ref 4.6–6.5)

## 2024-05-17 NOTE — Progress Notes (Signed)
 Labs added.

## 2024-05-17 NOTE — Addendum Note (Signed)
 Addended by: VINCENTE SABER on: 05/17/2024 10:47 AM   Modules accepted: Orders

## 2024-05-18 ENCOUNTER — Ambulatory Visit: Payer: Self-pay | Admitting: Nurse Practitioner

## 2024-05-18 NOTE — Progress Notes (Signed)
 Will hold for the OV on 05/24/24

## 2024-05-24 ENCOUNTER — Encounter: Payer: Self-pay | Admitting: Nurse Practitioner

## 2024-05-24 ENCOUNTER — Ambulatory Visit: Admitting: Nurse Practitioner

## 2024-05-24 VITALS — BP 116/74 | HR 75 | Temp 98.2°F | Ht 70.0 in | Wt 128.6 lb

## 2024-05-24 DIAGNOSIS — Z23 Encounter for immunization: Secondary | ICD-10-CM | POA: Diagnosis not present

## 2024-05-24 DIAGNOSIS — E785 Hyperlipidemia, unspecified: Secondary | ICD-10-CM

## 2024-05-24 DIAGNOSIS — J4520 Mild intermittent asthma, uncomplicated: Secondary | ICD-10-CM | POA: Diagnosis not present

## 2024-05-24 DIAGNOSIS — R7303 Prediabetes: Secondary | ICD-10-CM

## 2024-05-24 NOTE — Patient Instructions (Signed)

## 2024-05-24 NOTE — Progress Notes (Signed)
 Established Patient Office Visit  Subjective:  Patient ID: Kyle Shields, male    DOB: Oct 22, 1970  Age: 53 y.o. MRN: [id number]  CC:  Chief Complaint  Patient presents with   Medical Management of Chronic Issues   Discussed the use of a AI scribe software for clinical note transcription with the patient, who gave verbal consent to proceed.  HPI  Kyle Shields is a 53 year old male with prediabetes, hyperlipidemia, and asthma who presents for a follow-up visit. He has no concerns at present. Labs reviewed.   Asthma is stable on albuterol  inhaler.  Prediabetes is progressing with an A1c increase from 5.9% to 6.4%.   He consumes a lot of junk food. Cholesterol levels show a slightly elevated LDL of 122 mg/dL.  He quit smoking in 2024 and denies current tobacco or vaping use.  Alcohol intake includes three to four cans of beer on weekends, totaling about a twelve-pack per week. He lives with a friend and is currently unemployed and single.    HPI   Past Medical History:  Diagnosis Date   Asthma     Past Surgical History:  Procedure Laterality Date   COLONOSCOPY WITH PROPOFOL  N/A 08/26/2022   Procedure: COLONOSCOPY WITH PROPOFOL ;  Surgeon: Therisa Bi, MD;  Location: [facility] ENDOSCOPY;  Service: Gastroenterology;  Laterality: N/A;  MEDICAID TRANSPORTATION - NEEDS 8 AM ARRIVAL    Family History  Problem Relation Age of Onset   Cancer Maternal Grandfather        colon cancer    Social History   Socioeconomic History   Marital status: Single    Spouse name: Not on file   Number of children: Not on file   Years of education: Not on file   Highest education level: Some college, no degree  Occupational History   Not on file  Tobacco Use   Smoking status: Former    Current packs/day: 1.00    Types: Cigarettes   Smokeless tobacco: Never   Tobacco comments:    Quit cold [place] March, 2024  Vaping Use   Vaping status: Former  Substance and Sexual Activity    Alcohol use: Yes    Alcohol/week: 12.0 standard drinks of alcohol    Types: 12 Cans of beer per week   Drug use: Not Currently   Sexual activity: Not on file  Other Topics Concern   Not on file  Social History Narrative   Not on file   Social Drivers of Health   Financial Resource Strain: Low Risk  (05/21/2024)   Overall Financial Resource Strain (CARDIA)    Difficulty of Paying Living Expenses: Not very hard  Food Insecurity: No Food Insecurity (05/21/2024)   Hunger Vital Sign    Worried About Running Out of Food in the Last Year: Never true    Ran Out of Food in the Last Year: Never true  Transportation Needs: No Transportation Needs (05/21/2024)   PRAPARE - Administrator, Civil Service (Medical): No    Lack of Transportation (Non-Medical): No  Physical Activity: Insufficiently Active (05/21/2024)   Exercise Vital Sign    Days of Exercise per Week: 2 days    Minutes of Exercise per Session: 30 min  Stress: Stress Concern Present (05/21/2024)   [employer] of Occupational Health - Occupational Stress Questionnaire    Feeling of Stress: To some extent  Social Connections: Moderately Integrated (05/21/2024)   Social Connection and Isolation Panel    Frequency of Communication  with Friends and Family: Three times a week    Frequency of Social Gatherings with Friends and Family: Not on file    Attends Religious Services: 1 to 4 times per year    Active Member of [employer] or Organizations: No    Attends Engineer, structural: Not on file    Marital Status: Living with partner  Intimate Partner Violence: Not on file     Outpatient Medications Prior to Visit  Medication Sig Dispense Refill   albuterol  (VENTOLIN  HFA) 108 (90 Base) MCG/ACT inhaler Inhale 2 puffs into the lungs every 6 (six) hours as needed for wheezing or shortness of breath. 18 each 3   budesonide -formoterol  (SYMBICORT ) 80-4.5 MCG/ACT inhaler Inhale 2 puffs into the lungs 2 (two) times daily.  10.2 g 3   No facility-administered medications prior to visit.    No Known Allergies  ROS Review of Systems Negative unless indicated in HPI.    Objective:    Physical Exam Constitutional:      Appearance: Normal appearance.  HENT:     Mouth/Throat:     Mouth: Mucous membranes are moist.  Eyes:     Conjunctiva/sclera: Conjunctivae normal.     Pupils: Pupils are equal, round, and reactive to light.  Cardiovascular:     Rate and Rhythm: Normal rate and regular rhythm.     Pulses: Normal pulses.     Heart sounds: Normal heart sounds.  Pulmonary:     Effort: Pulmonary effort is normal.     Breath sounds: Normal breath sounds.  Abdominal:     General: Bowel sounds are normal.     Palpations: Abdomen is soft.  Musculoskeletal:     Cervical back: Normal range of motion. No tenderness.  Skin:    General: Skin is warm.     Findings: No bruising.  Neurological:     General: No focal deficit present.     Mental Status: He is alert and oriented to person, place, and time. Mental status is at baseline.  Psychiatric:        Mood and Affect: Mood normal.        Behavior: Behavior normal.        Thought Content: Thought content normal.        Judgment: Judgment normal.     BP 116/74   Pulse 75   Temp 98.2 F (36.8 C)   Ht 5' 10 (1.778 m)   Wt 128 lb 9.6 oz (58.3 kg)   SpO2 97%   BMI 18.45 kg/m  Wt Readings from Last 3 Encounters:  05/24/24 128 lb 9.6 oz (58.3 kg)  11/10/23 131 lb 3.2 oz (59.5 kg)  05/13/23 132 lb (59.9 kg)     Health Maintenance  Topic Date Due   HIV Screening  Never done   Hepatitis C Screening  Never done   Pneumococcal Vaccine: 50+ Years (1 of 2 - PCV) Never done   Hepatitis B Vaccines 19-59 Average Risk (1 of 3 - 19+ 3-dose series) Never done   Zoster Vaccines- Shingrix (1 of 2) Never done   COVID-19 Vaccine (3 - 2025-26 season) 06/08/2024 (Originally 04/23/2024)   DTaP/Tdap/Td (2 - Td or Tdap) 08/14/2031   Colonoscopy  08/26/2032    Influenza Vaccine  Completed   HPV VACCINES  Aged Out   Meningococcal B Vaccine  Aged Out       Topic Date Due   Hepatitis B Vaccines 19-59 Average Risk (1 of 3 - 19+ 3-dose series)  Never done    Lab Results  Component Value Date   TSH 1.41 05/17/2024   Lab Results  Component Value Date   WBC 5.4 05/17/2024   HGB 13.3 05/17/2024   HCT 40.7 05/17/2024   MCV 88.3 05/17/2024   PLT 191.0 05/17/2024   Lab Results  Component Value Date   NA 141 05/17/2024   K 4.0 05/17/2024   CO2 28 05/17/2024   GLUCOSE 105 (H) 05/17/2024   BUN 15 05/17/2024   CREATININE 1.06 05/17/2024   BILITOT 1.0 05/17/2024   ALKPHOS 46 05/17/2024   AST 28 05/17/2024   ALT 30 05/17/2024   PROT 7.1 05/17/2024   ALBUMIN 4.5 05/17/2024   CALCIUM 9.3 05/17/2024   EGFR 85 05/13/2023   GFR 79.10 05/17/2024   Lab Results  Component Value Date   CHOL 194 05/17/2024   Lab Results  Component Value Date   HDL 47.30 05/17/2024   Lab Results  Component Value Date   LDLCALC 122 (H) 05/17/2024   Lab Results  Component Value Date   TRIG 123.0 05/17/2024   Lab Results  Component Value Date   CHOLHDL 4 05/17/2024   Lab Results  Component Value Date   HGBA1C 6.4 05/17/2024      Assessment & Plan:  Need for immunization against influenza -     Flu vaccine trivalent PF, 6mos and older(Flulaval,Afluria,Fluarix,Fluzone)  Prediabetes Assessment & Plan: A1c increased to 6.4%, indicating progression within prediabetic range. - Emphasized lifestyle modifications to prevent diabetes. - Encouraged regular exercise, e.g., walking 20 minutes daily. - Advised dietary changes: more fruits/vegetables, less processed foods. - Provided dietary printout.    Mild intermittent asthma without complication Assessment & Plan: Asthma well-controlled. - Continue current medication.   Dyslipidemia Assessment & Plan: LDL cholesterol at 122 mg/dL, above target of <899 mg/dL. - Advised dietary modifications to  improve cholesterol levels. Lipid Panel     Component Value Date/Time   CHOL 194 05/17/2024 1053   CHOL 212 (H) 05/13/2023 1444   TRIG 123.0 05/17/2024 1053   HDL 47.30 05/17/2024 1053   HDL 44 05/13/2023 1444   CHOLHDL 4 05/17/2024 1053   VLDL 24.6 05/17/2024 1053   LDLCALC 122 (H) 05/17/2024 1053   LDLCALC 143 (H) 05/13/2023 1444   LDLCALC 122 (H) 08/12/2022 1008   LDLDIRECT 143 (H) 05/13/2023 1444   LABVLDL 25 05/13/2023 1444        Follow-up: Return in about 5 months (around 10/22/2024) for physical, follow up with fasting lab 2 days prior.   Ernesha Ramone, NP

## 2024-06-04 NOTE — Assessment & Plan Note (Signed)
 Asthma well-controlled. - Continue current medication.

## 2024-06-04 NOTE — Assessment & Plan Note (Signed)
 A1c increased to 6.4%, indicating progression within prediabetic range. - Emphasized lifestyle modifications to prevent diabetes. - Encouraged regular exercise, e.g., walking 20 minutes daily. - Advised dietary changes: more fruits/vegetables, less processed foods. - Provided dietary printout.

## 2024-06-04 NOTE — Assessment & Plan Note (Signed)
 LDL cholesterol at 122 mg/dL, above target of <899 mg/dL. - Advised dietary modifications to improve cholesterol levels. Lipid Panel     Component Value Date/Time   CHOL 194 05/17/2024 1053   CHOL 212 (H) 05/13/2023 1444   TRIG 123.0 05/17/2024 1053   HDL 47.30 05/17/2024 1053   HDL 44 05/13/2023 1444   CHOLHDL 4 05/17/2024 1053   VLDL 24.6 05/17/2024 1053   LDLCALC 122 (H) 05/17/2024 1053   LDLCALC 143 (H) 05/13/2023 1444   LDLCALC 122 (H) 08/12/2022 1008   LDLDIRECT 143 (H) 05/13/2023 1444   LABVLDL 25 05/13/2023 1444

## 2024-10-24 ENCOUNTER — Other Ambulatory Visit

## 2024-10-26 ENCOUNTER — Encounter: Admitting: Nurse Practitioner
